# Patient Record
Sex: Female | Born: 1970 | Race: Black or African American | Hispanic: No | Marital: Single | Smoking: Current some day smoker
Health system: Southern US, Community
[De-identification: ages and names within clinical notes are randomized; demographics above are authoritative.]

## PROBLEM LIST (undated history)

## (undated) HISTORY — PX: KIDNEY SURGERY: SHX687

---

## 2008-05-30 ENCOUNTER — Emergency Department (HOSPITAL_COMMUNITY): Admission: EM | Admit: 2008-05-30 | Discharge: 2008-05-30 | Payer: Self-pay | Admitting: *Deleted

## 2009-02-19 ENCOUNTER — Emergency Department (HOSPITAL_COMMUNITY): Admission: EM | Admit: 2009-02-19 | Discharge: 2009-02-19 | Payer: Self-pay | Admitting: Emergency Medicine

## 2010-07-01 LAB — COMPREHENSIVE METABOLIC PANEL
ALT: 25 U/L (ref 0–35)
AST: 33 U/L (ref 0–37)
Albumin: 4.1 g/dL (ref 3.5–5.2)
Calcium: 9.5 mg/dL (ref 8.4–10.5)
Creatinine, Ser: 0.93 mg/dL (ref 0.4–1.2)
GFR calc Af Amer: >60 mL/min (ref >60)
Sodium: 136 mEq/L (ref 135–145)
Total Protein: 7.5 g/dL (ref 6.0–8.3)

## 2010-07-01 LAB — LIPASE, BLOOD: Lipase: 25 U/L (ref 11–59)

## 2010-07-01 LAB — CBC
MCHC: 34.2 g/dL (ref 30.0–36.0)
MCV: 91.9 fL (ref 78.0–100.0)
Platelets: 214 10*3/uL (ref 150–400)
RBC: 4.79 MIL/uL (ref 3.87–5.11)
RDW: 14.8 % (ref 11.5–15.5)

## 2010-07-01 LAB — URINE MICROSCOPIC-ADD ON

## 2010-07-01 LAB — URINALYSIS, ROUTINE W REFLEX MICROSCOPIC
Glucose, UA: NEGATIVE mg/dL
Protein, ur: 100 mg/dL — AB
pH: 6 (ref 5.0–8.0)

## 2010-07-01 LAB — DIFFERENTIAL
Basophils Relative: 1 % (ref 0–1)
Eosinophils Relative: 0 % (ref 0–5)
Lymphs Abs: 1.1 10*3/uL (ref 0.7–4.0)
Monocytes Absolute: 0.5 10*3/uL (ref 0.1–1.0)
Monocytes Relative: 7 % (ref 3–12)

## 2016-12-22 ENCOUNTER — Other Ambulatory Visit: Payer: Self-pay | Admitting: Family Medicine

## 2016-12-22 DIAGNOSIS — N921 Excessive and frequent menstruation with irregular cycle: Secondary | ICD-10-CM

## 2016-12-22 DIAGNOSIS — R102 Pelvic and perineal pain: Secondary | ICD-10-CM

## 2016-12-27 ENCOUNTER — Other Ambulatory Visit: Payer: Self-pay

## 2016-12-29 ENCOUNTER — Ambulatory Visit
Admission: RE | Admit: 2016-12-29 | Discharge: 2016-12-29 | Disposition: A | Discharge status: Home / Self Care | Payer: BLUE CROSS/BLUE SHIELD | Source: Ambulatory Visit | Attending: Family Medicine | Admitting: Family Medicine

## 2016-12-29 DIAGNOSIS — N921 Excessive and frequent menstruation with irregular cycle: Secondary | ICD-10-CM

## 2016-12-29 DIAGNOSIS — R102 Pelvic and perineal pain: Secondary | ICD-10-CM

## 2018-08-28 ENCOUNTER — Ambulatory Visit
Admission: RE | Admit: 2018-08-28 | Discharge: 2018-08-28 | Disposition: A | Discharge status: Home / Self Care | Payer: BLUE CROSS/BLUE SHIELD | Source: Ambulatory Visit | Attending: Family Medicine | Admitting: Family Medicine

## 2018-08-28 ENCOUNTER — Other Ambulatory Visit: Payer: Self-pay | Admitting: Family Medicine

## 2018-08-28 ENCOUNTER — Other Ambulatory Visit: Payer: Self-pay

## 2018-08-28 DIAGNOSIS — M542 Cervicalgia: Secondary | ICD-10-CM

## 2020-03-05 IMAGING — CR CERVICAL SPINE - COMPLETE 4+ VIEW
5 series · 5 of 5 positions shown · non-contrast
Comparison: None.

CLINICAL DATA: Neck pain.  Left arm numbness.  No known injury.

EXAM:
CERVICAL SPINE - COMPLETE 4+ VIEW

[w cervical spine lat]
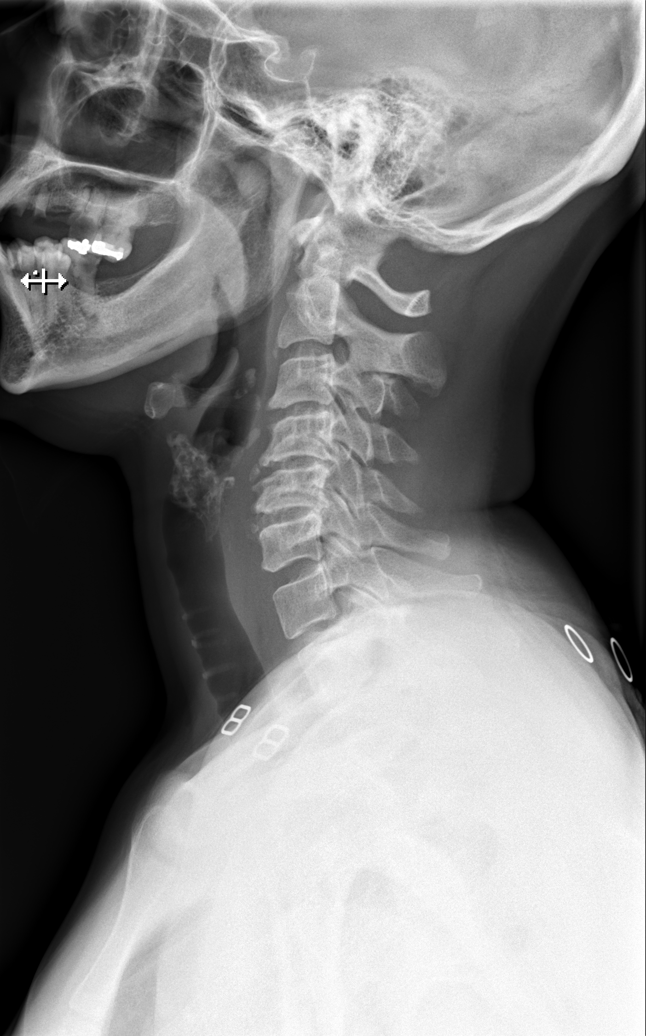

[w cervical spine ap_obl (1 of 2)]
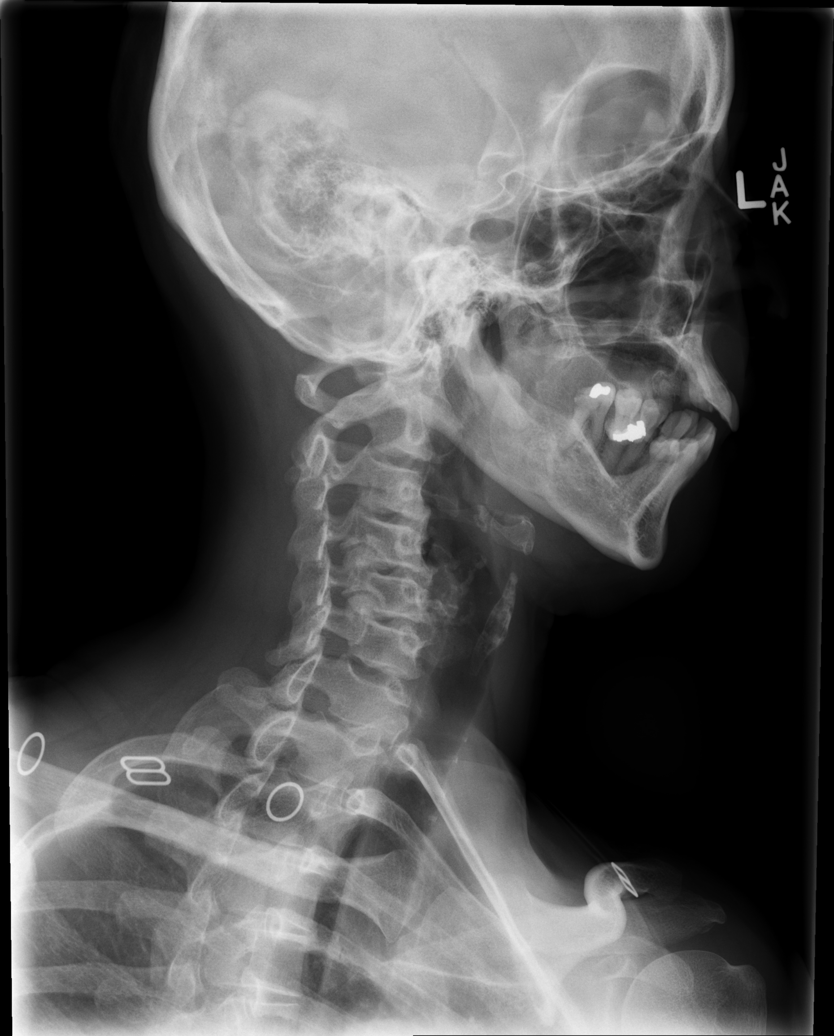

[w cervical spine ap_obl (2 of 2)]
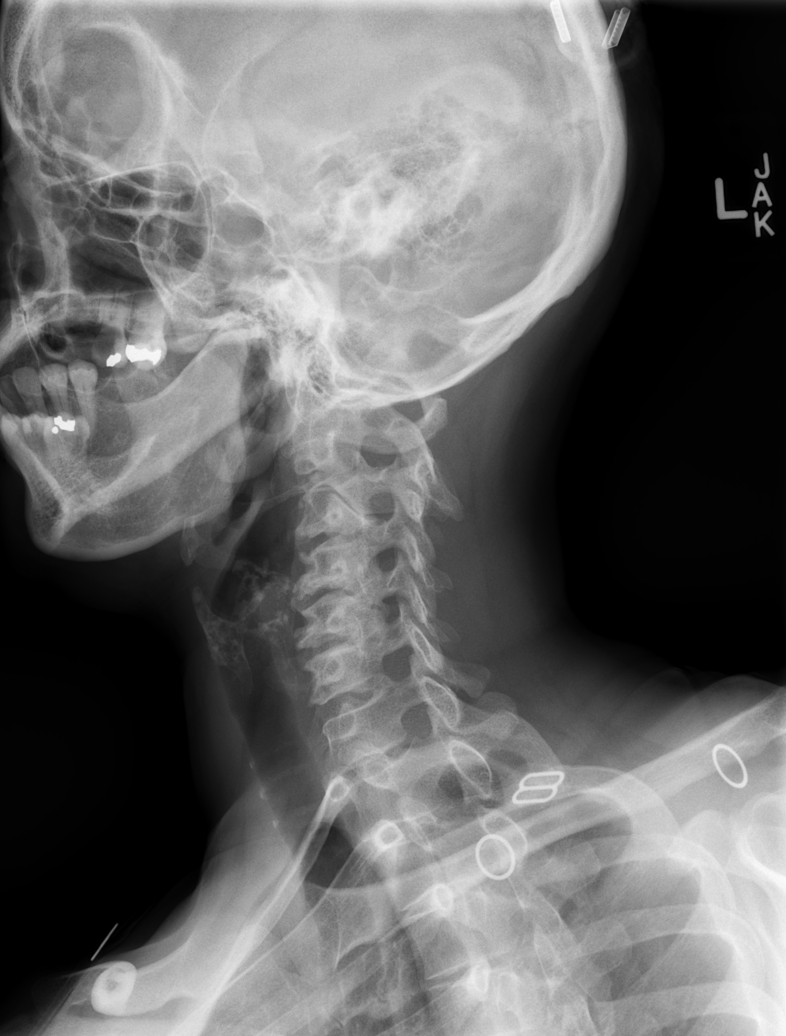

[w cervical spine ap (1 of 2)]
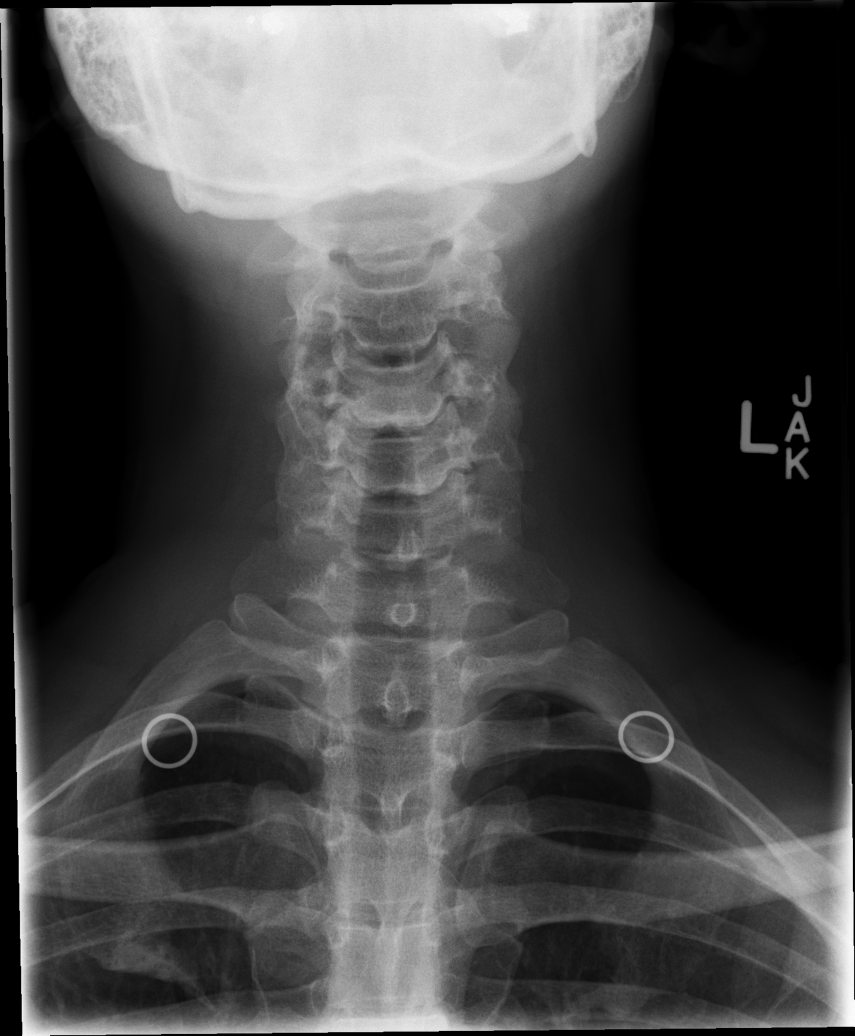

[w cervical spine ap (2 of 2)]
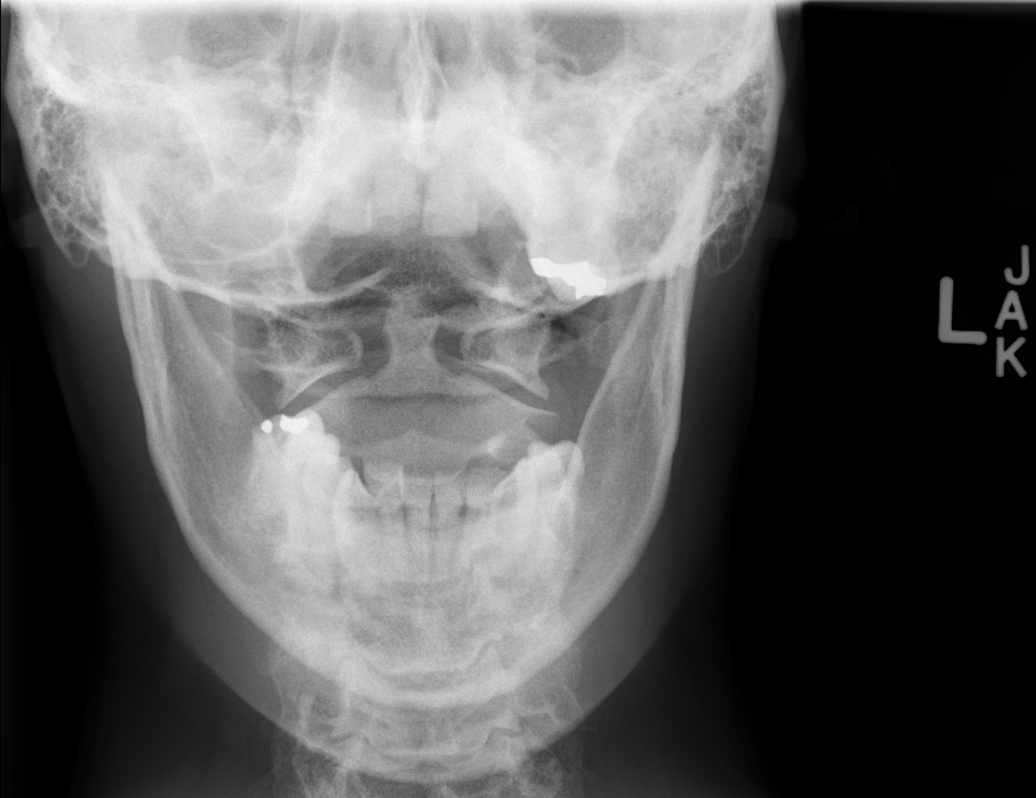

[5 of 5 positions shown; findings below may reference images not displayed]

FINDINGS: No fracture or traumatic malalignment. Degenerative changes at C3-4,
C4-5, and C5-6. Small posterior osteophytes are seen at C4-5 and
C5-6. Mild narrowing of the right C4-5 and C5-6 neural foramina by
tiny posterior osteophytes. Mild narrowing of the left C4-5 neural
foramen by small posterior osteophyte.
IMPRESSION: Degenerative changes including anterior and posterior osteophytes.
Mild narrowing of the right C4-5 and C5-6 neural foramina as well as
the left C4-5 neural foramen.

## 2020-03-31 ENCOUNTER — Ambulatory Visit (HOSPITAL_COMMUNITY)
Admission: EM | Admit: 2020-03-31 | Discharge: 2020-03-31 | Disposition: A | Discharge status: Home / Self Care | Payer: HRSA Program | Attending: Emergency Medicine | Admitting: Emergency Medicine

## 2020-03-31 ENCOUNTER — Encounter (HOSPITAL_COMMUNITY): Payer: Self-pay | Admitting: Emergency Medicine

## 2020-03-31 ENCOUNTER — Other Ambulatory Visit: Payer: Self-pay

## 2020-03-31 DIAGNOSIS — B349 Viral infection, unspecified: Secondary | ICD-10-CM

## 2020-03-31 DIAGNOSIS — Z20822 Contact with and (suspected) exposure to covid-19: Secondary | ICD-10-CM | POA: Diagnosis not present

## 2020-03-31 DIAGNOSIS — R079 Chest pain, unspecified: Secondary | ICD-10-CM

## 2020-03-31 DIAGNOSIS — M940 Chondrocostal junction syndrome [Tietze]: Secondary | ICD-10-CM | POA: Diagnosis present

## 2020-03-31 LAB — SARS CORONAVIRUS 2 (TAT 6-24 HRS): SARS Coronavirus 2: NEGATIVE

## 2020-03-31 MED ORDER — PROMETHAZINE-DM 6.25-15 MG/5ML PO SYRP: 5.0000 mL | ORAL_SOLUTION | Freq: Four times a day (QID) | ORAL | 0 refills | Status: AC | PRN

## 2020-03-31 MED ORDER — IBUPROFEN 600 MG PO TABS: 600.0000 mg | ORAL_TABLET | Freq: Four times a day (QID) | ORAL | 0 refills | Status: AC | PRN

## 2020-03-31 MED ORDER — BENZONATATE 100 MG PO CAPS: 200.0000 mg | ORAL_CAPSULE | Freq: Three times a day (TID) | ORAL | 0 refills | Status: AC | PRN

## 2020-03-31 NOTE — ED Triage Notes (Incomplete)
Pt presents with chest pain/ pressure. States was getting ready for work this am and the chest pain started.

## 2020-03-31 NOTE — Discharge Instructions (Addendum)
You will need to isolate at home pending your Covid test results.  If you test positive, you will have to quarantine for 10 days from your symptoms started.  After 10 days you can break quarantine if your symptoms have improved and you have not had a fever for 24 hours on taking Tylenol and ibuprofen.  Take the 60 mg of ibuprofen every 6 hours with food as needed for your chest pain.  Take the [facility] every 8 hours during the day as needed for cough.  Take them with a small sip of water.  They want make you drowsy but they may give you a numbness to the base of your tongue or metallic taste in her mouth, this is normal.  Use the cough syrup at bedtime for cough, congestion, and sleep as needed.  It will make you drowsy.  If you develop any worsening of your symptoms, especially shortness of breath, he developed shortness of breath at rest, you cannot speak in full sentences, or you develop bluing to your lips you go to the ER for evaluation.

## 2020-03-31 NOTE — ED Provider Notes (Signed)
[facility]    CSN: [id number] Arrival date & time: 03/31/20  0801      History   Chief Complaint Chief Complaint  Patient presents with  . Chest Pain  . Weakness    HPI Susan Yoder is a 50 y.o. female.   HPI   50 year old female here for evaluation of chest pain and fatigue.  Patient reports that her pain started this morning when she woke up.  Patient reports that is located in the middle of her chest and does not radiate.  Patient also reports that she has had a nonproductive cough, mild shortness of breath, runny nose, and some diarrhea.  Patient denies sweating, nausea or vomiting, sore throat, ear pain or pressure, or fever.  Patient reports that she has had her 2 doses of Pfizer vaccine but has not received her booster.  Her cough, runny nose, and fatigue started 2 days ago and her diarrhea started yesterday.  History reviewed. No pertinent past medical history.  There are no problems to display for this patient.   Past Surgical History:  Procedure Laterality Date  . KIDNEY SURGERY      OB History   No obstetric history on file.      Home Medications    Prior to Admission medications   Medication Sig Start Date End Date Taking? Authorizing Provider  benzonatate (TESSALON) 100 MG capsule Take 2 capsules (200 mg total) by mouth 3 (three) times daily as needed for cough. 03/31/20  Yes Becky Augusta, NP  ibuprofen (ADVIL) 600 MG tablet Take 1 tablet (600 mg total) by mouth every 6 (six) hours as needed. 03/31/20  Yes Becky Augusta, NP  promethazine-dextromethorphan (PROMETHAZINE-DM) 6.25-15 MG/5ML syrup Take 5 mLs by mouth 4 (four) times daily as needed for cough. 03/31/20  Yes Becky Augusta, NP    Family History Family History  Problem Relation Age of Onset  . Hypertension Mother   . Heart failure Father   . Alcoholism Father     Social History Social History   Tobacco Use  . Smoking status: Current Some Day Smoker  . Smokeless tobacco: Never  Used  Substance Use Topics  . Alcohol use: Yes     Allergies   Patient has no allergy information on record.   Review of Systems Review of Systems  Constitutional: Positive for fatigue. Negative for activity change, appetite change and fever.  HENT: Positive for rhinorrhea. Negative for ear pain and sore throat.   Respiratory: Positive for cough and shortness of breath. Negative for wheezing.   Cardiovascular: Positive for chest pain. Negative for palpitations and leg swelling.  Gastrointestinal: Positive for diarrhea. Negative for abdominal pain, nausea and vomiting.  Skin: Negative for rash.  Neurological: Negative for syncope.  Hematological: Negative.   Psychiatric/Behavioral: Negative.      Physical Exam Triage Vital Signs ED Triage Vitals  Enc Vitals Group     BP      Pulse      Resp      Temp      Temp src      SpO2      Weight      Height      Head Circumference      Peak Flow      Pain Score      Pain Loc      Pain Edu?      Excl. in GC?    No data found.  Updated Vital Signs BP (!) 132/97 (BP  Location: Left Arm)   Pulse 79   Temp 98 F (36.7 C) (Oral)   Resp 17   LMP 03/29/2020   SpO2 99%   Visual Acuity Right Eye Distance:   Left Eye Distance:   Bilateral Distance:    Right Eye Near:   Left Eye Near:    Bilateral Near:     Physical Exam Vitals and nursing note reviewed.  Constitutional:      General: She is not in acute distress.    Appearance: She is well-developed. She is not toxic-appearing.  HENT:     Head: Normocephalic and atraumatic.     Right Ear: Tympanic membrane, ear canal and external ear normal.     Left Ear: Tympanic membrane, ear canal and external ear normal.     Nose: Rhinorrhea present. No congestion.     Mouth/Throat:     Mouth: Mucous membranes are moist.     Pharynx: Oropharynx is clear. No oropharyngeal exudate or posterior oropharyngeal erythema.  Cardiovascular:     Rate and Rhythm: Normal rate and regular  rhythm.     Pulses: Normal pulses.     Heart sounds: Normal heart sounds. No murmur heard. No gallop.   Pulmonary:     Effort: Pulmonary effort is normal.     Breath sounds: Normal breath sounds. No wheezing, rhonchi or rales.  Musculoskeletal:        General: Tenderness present.     Cervical back: Normal range of motion and neck supple.  Lymphadenopathy:     Cervical: No cervical adenopathy.  Skin:    General: Skin is warm and dry.     Capillary Refill: Capillary refill takes less than 2 seconds.     Findings: No erythema or rash.  Neurological:     General: No focal deficit present.     Mental Status: She is alert and oriented to person, place, and time.  Psychiatric:        Mood and Affect: Mood normal.        Behavior: Behavior normal.        Thought Content: Thought content normal.        Judgment: Judgment normal.      UC Treatments / Results  Labs (all labs ordered are listed, but only abnormal results are displayed) Labs Reviewed  SARS CORONAVIRUS 2 (TAT 6-24 HRS)    EKG   Radiology No results found.  Procedures Procedures (including critical care time)  Medications Ordered in UC Medications - No data to display  Initial Impression / Assessment and Plan / UC Course  I have reviewed the triage vital signs and the nursing notes.  Pertinent labs & imaging results that were available during my care of the patient were reviewed by me and considered in my medical decision making (see chart for details).   Patient is here for evaluation of chest pain and weakness.  Patient reports that she is also had a cough, shortness of breath, runny nose for the last 2 days and that she developed diarrhea yesterday.  Patient denies any sweating or nausea.  Her chest pain does not radiate to her arm, jaw, or back.  Patient is nontoxic in appearance.  Chest pain is reproducible to palpation along both sides of her sternal border.  Increases with movement.  Patient's exam is  consistent with costochondritis possibly secondary to her coughing.  Patient symptoms also concerning for Covid.  Will swab for Covid and send patient home to isolate pending the  results.  Will treat costochondritis with NSAIDs and moist heat.  Will also give Tessalon Perles and Promethazine DM for cough.  EKG obtained secondary to chest pain complaint.  EKG shows normal sinus rhythm with a rate of 75.  PR intervals 148, QRS 68, EKG is normal axis and there are no ST changes.   Final Clinical Impressions(s) / UC Diagnoses   Final diagnoses:  Viral illness  Costochondritis     Discharge Instructions     You will need to isolate at home pending your Covid test results.  If you test positive, you will have to quarantine for 10 days from your symptoms started.  After 10 days you can break quarantine if your symptoms have improved and you have not had a fever for 24 hours on taking Tylenol and ibuprofen.  Take the 60 mg of ibuprofen every 6 hours with food as needed for your chest pain.  Take the [facility] every 8 hours during the day as needed for cough.  Take them with a small sip of water.  They want make you drowsy but they may give you a numbness to the base of your tongue or metallic taste in her mouth, this is normal.  Use the cough syrup at bedtime for cough, congestion, and sleep as needed.  It will make you drowsy.  If you develop any worsening of your symptoms, especially shortness of breath, he developed shortness of breath at rest, you cannot speak in full sentences, or you develop bluing to your lips you go to the ER for evaluation.    ED Prescriptions    Medication Sig Dispense Auth. Provider   ibuprofen (ADVIL) 600 MG tablet Take 1 tablet (600 mg total) by mouth every 6 (six) hours as needed. 30 tablet Margarette Canada, NP   benzonatate (TESSALON) 100 MG capsule Take 2 capsules (200 mg total) by mouth 3 (three) times daily as needed for cough. 21 capsule Margarette Canada, NP    promethazine-dextromethorphan (PROMETHAZINE-DM) 6.25-15 MG/5ML syrup Take 5 mLs by mouth 4 (four) times daily as needed for cough. 118 mL Margarette Canada, NP     PDMP not reviewed this encounter.   Margarette Canada, NP 03/31/20 [phone]

## 2021-06-16 ENCOUNTER — Encounter: Payer: Self-pay | Admitting: Emergency Medicine

## 2021-06-16 ENCOUNTER — Ambulatory Visit
Admission: EM | Admit: 2021-06-16 | Discharge: 2021-06-16 | Disposition: A | Discharge status: Home / Self Care | Payer: Medicaid Other | Attending: Internal Medicine | Admitting: Internal Medicine

## 2021-06-16 ENCOUNTER — Other Ambulatory Visit: Payer: Self-pay

## 2021-06-16 DIAGNOSIS — J018 Other acute sinusitis: Secondary | ICD-10-CM

## 2021-06-16 MED ORDER — AMOXICILLIN 875 MG PO TABS: 875.0000 mg | ORAL_TABLET | Freq: Two times a day (BID) | ORAL | 0 refills | Status: AC

## 2021-06-16 NOTE — ED Triage Notes (Signed)
Pt here for nasal congestion x 5 days without relief using OTC meds  ?

## 2021-06-16 NOTE — ED Provider Notes (Signed)
?[facility] ? ? ? ?CSN: [id number] ?Arrival date & time: 06/16/21  1208 ? ? ?  ? ?History   ?Chief Complaint ?Chief Complaint  ?Patient presents with  ? Nasal Congestion  ? ? ?HPI ?Susan Yoder is a 51 y.o. female.  ? ?Patient presents with nasal congestion and sinus pressure that has been present for approximately 5 days.  Denies any known sick contacts or fevers.  Denies cough, chest pain, shortness of breath, sore throat, ear pain, nausea, vomiting, diarrhea, abdominal pain.  Patient reports that she has tried multiple different over-the-counter cough and cold medications with no improvement in symptoms. ? ? ? ?History reviewed. No pertinent past medical history. ? ?There are no problems to display for this patient. ? ? ?Past Surgical History:  ?Procedure Laterality Date  ? KIDNEY SURGERY    ? ? ?OB History   ?No obstetric history on file. ?  ? ? ? ?Home Medications   ? ?Prior to Admission medications   ?Medication Sig Start Date End Date Taking? Authorizing Provider  ?amoxicillin (AMOXIL) 875 MG tablet Take 1 tablet (875 mg total) by mouth 2 (two) times daily for 10 days. 06/16/21 06/26/21 Yes Anaira Seay, Acie Fredrickson, FNP  ?benzonatate (TESSALON) 100 MG capsule Take 2 capsules (200 mg total) by mouth 3 (three) times daily as needed for cough. ?Patient not taking: Reported on 06/16/2021 03/31/20   Becky Augusta, NP  ?ibuprofen (ADVIL) 600 MG tablet Take 1 tablet (600 mg total) by mouth every 6 (six) hours as needed. 03/31/20   Becky Augusta, NP  ?promethazine-dextromethorphan (PROMETHAZINE-DM) 6.25-15 MG/5ML syrup Take 5 mLs by mouth 4 (four) times daily as needed for cough. ?Patient not taking: Reported on 06/16/2021 03/31/20   Becky Augusta, NP  ? ? ?Family History ?Family History  ?Problem Relation Age of Onset  ? Hypertension Mother   ? Heart failure Father   ? Alcoholism Father   ? ? ?Social History ?Social History  ? ?Tobacco Use  ? Smoking status: Some Days  ? Smokeless tobacco: Never  ?Substance Use Topics  ?  Alcohol use: Yes  ? ? ? ?Allergies   ?Patient has no known allergies. ? ? ?Review of Systems ?Review of Systems ?Per HPI ? ?Physical Exam ?Triage Vital Signs ?ED Triage Vitals [06/16/21 1301]  ?Enc Vitals Group  ?   BP (!) 154/83  ?   Pulse Rate 86  ?   Resp 18  ?   Temp 98 ?F (36.7 ?C)  ?   Temp Source Oral  ?   SpO2 98 %  ?   Weight   ?   Height   ?   Head Circumference   ?   Peak Flow   ?   Pain Score 0  ?   Pain Loc   ?   Pain Edu?   ?   Excl. in GC?   ? ?No data found. ? ?Updated Vital Signs ?BP (!) 154/83 (BP Location: Left Arm)   Pulse 86   Temp 98 ?F (36.7 ?C) (Oral)   Resp 18   SpO2 98%  ? ?Visual Acuity ?Right Eye Distance:   ?Left Eye Distance:   ?Bilateral Distance:   ? ?Right Eye Near:   ?Left Eye Near:    ?Bilateral Near:    ? ?Physical Exam ?Constitutional:   ?   General: She is not in acute distress. ?   Appearance: Normal appearance. She is not toxic-appearing or diaphoretic.  ?HENT:  ?  Head: Normocephalic and atraumatic.  ?   Right Ear: Tympanic membrane and ear canal normal.  ?   Left Ear: Tympanic membrane and ear canal normal.  ?   Nose: Congestion present.  ?   Right Sinus: Maxillary sinus tenderness present.  ?   Left Sinus: Maxillary sinus tenderness present.  ?   Mouth/Throat:  ?   Mouth: Mucous membranes are moist.  ?   Pharynx: No posterior oropharyngeal erythema.  ?Eyes:  ?   Extraocular Movements: Extraocular movements intact.  ?   Conjunctiva/sclera: Conjunctivae normal.  ?   Pupils: Pupils are equal, round, and reactive to light.  ?Cardiovascular:  ?   Rate and Rhythm: Normal rate and regular rhythm.  ?   Pulses: Normal pulses.  ?   Heart sounds: Normal heart sounds.  ?Pulmonary:  ?   Effort: Pulmonary effort is normal. No respiratory distress.  ?   Breath sounds: Normal breath sounds. No stridor. No wheezing, rhonchi or rales.  ?Abdominal:  ?   General: Abdomen is flat. Bowel sounds are normal.  ?   Palpations: Abdomen is soft.  ?Musculoskeletal:     ?   General: Normal range of  motion.  ?   Cervical back: Normal range of motion.  ?Skin: ?   General: Skin is warm and dry.  ?Neurological:  ?   General: No focal deficit present.  ?   Mental Status: She is alert and oriented to person, place, and time. Mental status is at baseline.  ?Psychiatric:     ?   Mood and Affect: Mood normal.     ?   Behavior: Behavior normal.  ? ? ? ?UC Treatments / Results  ?Labs ?(all labs ordered are listed, but only abnormal results are displayed) ?Labs Reviewed - No data to display ? ?EKG ? ? ?Radiology ?No results found. ? ?Procedures ?Procedures (including critical care time) ? ?Medications Ordered in UC ?Medications - No data to display ? ?Initial Impression / Assessment and Plan / UC Course  ?I have reviewed the triage vital signs and the nursing notes. ? ?Pertinent labs & imaging results that were available during my care of the patient were reviewed by me and considered in my medical decision making (see chart for details). ? ?  ? ?Physical exam and patient's symptoms are consistent with acute sinus infection.  Will treat with amoxicillin antibiotic.  Discussed supportive care with patient.  Discussed return precautions.  Patient verbalized understanding and was agreeable with plan. ?Final Clinical Impressions(s) / UC Diagnoses  ? ?Final diagnoses:  ?Acute non-recurrent sinusitis of other sinus  ? ? ? ?Discharge Instructions   ? ?  ?You are being treated with an antibiotic for sinus infection.  Please follow-up if symptoms persist or worsen. ? ? ? ?ED Prescriptions   ? ? Medication Sig Dispense Auth. Provider  ? amoxicillin (AMOXIL) 875 MG tablet Take 1 tablet (875 mg total) by mouth 2 (two) times daily for 10 days. 20 tablet Ervin Knack E, [place]  ? ?  ? ?PDMP not reviewed this encounter. ?  ?Gustavus Bryant, [place] ?06/16/21 1318 ? ?

## 2021-06-16 NOTE — Discharge Instructions (Signed)
You are being treated with an antibiotic for sinus infection.  Please follow-up if symptoms persist or worsen. ?

## 2021-08-12 DIAGNOSIS — N879 Dysplasia of cervix uteri, unspecified: Secondary | ICD-10-CM | POA: Diagnosis not present

## 2021-09-07 DIAGNOSIS — N72 Inflammatory disease of cervix uteri: Secondary | ICD-10-CM | POA: Diagnosis not present

## 2021-09-07 DIAGNOSIS — D060 Carcinoma in situ of endocervix: Secondary | ICD-10-CM | POA: Diagnosis not present

## 2021-09-07 DIAGNOSIS — R69 Illness, unspecified: Secondary | ICD-10-CM | POA: Diagnosis not present

## 2021-09-07 DIAGNOSIS — D069 Carcinoma in situ of cervix, unspecified: Secondary | ICD-10-CM | POA: Diagnosis not present

## 2021-09-07 DIAGNOSIS — J45909 Unspecified asthma, uncomplicated: Secondary | ICD-10-CM | POA: Diagnosis not present

## 2021-09-07 DIAGNOSIS — N879 Dysplasia of cervix uteri, unspecified: Secondary | ICD-10-CM | POA: Diagnosis not present

## 2021-12-15 DIAGNOSIS — M5412 Radiculopathy, cervical region: Secondary | ICD-10-CM | POA: Diagnosis not present

## 2022-02-01 DIAGNOSIS — M47812 Spondylosis without myelopathy or radiculopathy, cervical region: Secondary | ICD-10-CM | POA: Diagnosis not present

## 2022-02-22 DIAGNOSIS — M47812 Spondylosis without myelopathy or radiculopathy, cervical region: Secondary | ICD-10-CM | POA: Diagnosis not present

## 2022-03-08 DIAGNOSIS — R519 Headache, unspecified: Secondary | ICD-10-CM | POA: Diagnosis not present

## 2022-03-08 DIAGNOSIS — M799 Soft tissue disorder, unspecified: Secondary | ICD-10-CM | POA: Diagnosis not present

## 2022-03-08 DIAGNOSIS — M47812 Spondylosis without myelopathy or radiculopathy, cervical region: Secondary | ICD-10-CM | POA: Diagnosis not present

## 2022-03-09 DIAGNOSIS — N879 Dysplasia of cervix uteri, unspecified: Secondary | ICD-10-CM | POA: Diagnosis not present

## 2022-03-10 DIAGNOSIS — J323 Chronic sphenoidal sinusitis: Secondary | ICD-10-CM | POA: Diagnosis not present

## 2022-03-10 DIAGNOSIS — R519 Headache, unspecified: Secondary | ICD-10-CM | POA: Diagnosis not present

## 2022-03-10 DIAGNOSIS — J3489 Other specified disorders of nose and nasal sinuses: Secondary | ICD-10-CM | POA: Diagnosis not present

## 2022-08-21 DIAGNOSIS — E119 Type 2 diabetes mellitus without complications: Secondary | ICD-10-CM | POA: Diagnosis not present

## 2022-08-21 DIAGNOSIS — Z809 Family history of malignant neoplasm, unspecified: Secondary | ICD-10-CM | POA: Diagnosis not present

## 2022-08-21 DIAGNOSIS — I10 Essential (primary) hypertension: Secondary | ICD-10-CM | POA: Diagnosis not present

## 2022-08-21 DIAGNOSIS — G8929 Other chronic pain: Secondary | ICD-10-CM | POA: Diagnosis not present

## 2022-08-21 DIAGNOSIS — F321 Major depressive disorder, single episode, moderate: Secondary | ICD-10-CM | POA: Diagnosis not present

## 2022-08-21 DIAGNOSIS — Z823 Family history of stroke: Secondary | ICD-10-CM | POA: Diagnosis not present

## 2022-08-21 DIAGNOSIS — E785 Hyperlipidemia, unspecified: Secondary | ICD-10-CM | POA: Diagnosis not present

## 2022-08-21 DIAGNOSIS — Z833 Family history of diabetes mellitus: Secondary | ICD-10-CM | POA: Diagnosis not present

## 2022-08-21 DIAGNOSIS — Z8249 Family history of ischemic heart disease and other diseases of the circulatory system: Secondary | ICD-10-CM | POA: Diagnosis not present

## 2022-08-21 DIAGNOSIS — Z91199 Patient's noncompliance with other medical treatment and regimen due to unspecified reason: Secondary | ICD-10-CM | POA: Diagnosis not present
